# Patient Record
Sex: Female | Born: 1950 | Race: White | Hispanic: No | Smoking: Current every day smoker
Health system: Southern US, Community
[De-identification: ages and names within clinical notes are randomized; demographics above are authoritative.]

## PROBLEM LIST (undated history)

## (undated) DIAGNOSIS — I10 Essential (primary) hypertension: Secondary | ICD-10-CM

## (undated) DIAGNOSIS — C4491 Basal cell carcinoma of skin, unspecified: Secondary | ICD-10-CM

## (undated) DIAGNOSIS — M654 Radial styloid tenosynovitis [de Quervain]: Secondary | ICD-10-CM

## (undated) DIAGNOSIS — M1990 Unspecified osteoarthritis, unspecified site: Secondary | ICD-10-CM

## (undated) DIAGNOSIS — E039 Hypothyroidism, unspecified: Secondary | ICD-10-CM

## (undated) DIAGNOSIS — E7800 Pure hypercholesterolemia, unspecified: Secondary | ICD-10-CM

## (undated) DIAGNOSIS — F4321 Adjustment disorder with depressed mood: Secondary | ICD-10-CM

## (undated) DIAGNOSIS — J189 Pneumonia, unspecified organism: Secondary | ICD-10-CM

## (undated) HISTORY — PX: FRACTURE SURGERY: SHX138

## (undated) HISTORY — PX: MOHS SURGERY: SUR867

---

## 1983-10-02 HISTORY — PX: APPENDECTOMY: SHX54

## 1999-12-19 ENCOUNTER — Other Ambulatory Visit: Admission: RE | Admit: 1999-12-19 | Discharge: 1999-12-19 | Payer: Self-pay | Admitting: *Deleted

## 2000-04-25 ENCOUNTER — Other Ambulatory Visit: Admission: RE | Admit: 2000-04-25 | Discharge: 2000-04-25 | Payer: Self-pay | Admitting: *Deleted

## 2003-02-16 ENCOUNTER — Other Ambulatory Visit: Admission: RE | Admit: 2003-02-16 | Discharge: 2003-02-16 | Payer: Self-pay | Admitting: Obstetrics & Gynecology

## 2005-08-09 ENCOUNTER — Inpatient Hospital Stay (HOSPITAL_COMMUNITY): Admission: RE | Admit: 2005-08-09 | Discharge: 2005-08-13 | Payer: Self-pay | Admitting: Psychiatry

## 2005-08-09 ENCOUNTER — Ambulatory Visit: Payer: Self-pay | Admitting: Psychiatry

## 2007-05-16 ENCOUNTER — Inpatient Hospital Stay (HOSPITAL_COMMUNITY): Admission: EM | Admit: 2007-05-16 | Discharge: 2007-05-19 | Payer: Self-pay | Admitting: Emergency Medicine

## 2007-05-16 ENCOUNTER — Ambulatory Visit: Payer: Self-pay | Admitting: Internal Medicine

## 2007-07-04 ENCOUNTER — Emergency Department (HOSPITAL_COMMUNITY): Admission: EM | Admit: 2007-07-04 | Discharge: 2007-07-05 | Payer: Self-pay | Admitting: Emergency Medicine

## 2007-12-12 ENCOUNTER — Encounter: Admission: RE | Admit: 2007-12-12 | Discharge: 2007-12-12 | Payer: Self-pay | Admitting: Family Medicine

## 2008-10-01 HISTORY — PX: ANKLE FRACTURE SURGERY: SHX122

## 2009-11-22 ENCOUNTER — Emergency Department (HOSPITAL_COMMUNITY): Admission: EM | Admit: 2009-11-22 | Discharge: 2009-11-22 | Payer: Self-pay | Admitting: Emergency Medicine

## 2010-12-20 LAB — URINALYSIS, ROUTINE W REFLEX MICROSCOPIC
Glucose, UA: NEGATIVE mg/dL
Nitrite: NEGATIVE
Protein, ur: NEGATIVE mg/dL
Urobilinogen, UA: 0.2 mg/dL (ref 0.0–1.0)

## 2010-12-20 LAB — CBC
HCT: 36.5 % (ref 36.0–46.0)
MCHC: 35.2 g/dL (ref 30.0–36.0)
Platelets: 210 10*3/uL (ref 150–400)
RDW: 12.8 % (ref 11.5–15.5)

## 2010-12-20 LAB — URINE MICROSCOPIC-ADD ON

## 2010-12-20 LAB — HEMOCCULT GUIAC POC 1CARD (OFFICE): Fecal Occult Bld: NEGATIVE

## 2010-12-20 LAB — DIFFERENTIAL
Eosinophils Absolute: 0.1 10*3/uL (ref 0.0–0.7)
Eosinophils Relative: 2 % (ref 0–5)
Neutro Abs: 1.7 10*3/uL (ref 1.7–7.7)
Neutrophils Relative %: 42 % — ABNORMAL LOW (ref 43–77)

## 2011-02-13 NOTE — Procedures (Signed)
HISTORY OF PRESENT ILLNESS:  This 60 year old patient has a history to  suspected seizures.  Medication list is Ativan, Librium, Zithromax,  heparin, Lexapro, Ambien, Darvocet N 100 and Phenergan.   TECHNICAL DESCRIPTION:  This EEG was recorded during the awake state and  shows much low-voltage fast beta activity.  The awake background  activity does show some higher frequency alpha rhythms in the 14-16 Hz  range.  Drowsiness is recorded but no definite stage II sleep was seen.  No evidence of any epileptiform activity was present.  This was a  portable EEG and photic stimulation and hyperventilation testing were  not performed.   IMPRESSION:  This is a normal EEG during the awake state with much low-  voltage fast beta activity suggestive of drug effect.  Should the  possibility of seizure be sought, then consideration of a sleep-deprived  EEG may be made.           ______________________________  Genene Churn. Sandria Manly, M.D.     XBJ:YNWG  D:  05/19/2007 16:57:06  T:  05/20/2007 10:38:18  Job #:  [id number]   cc:   Barnetta Chapel, MD

## 2011-02-13 NOTE — Discharge Summary (Signed)
[name], [name]                ACCOUNT NO.:  [id number]   MEDICAL RECORD NO.:  [id number]          PATIENT TYPE:  INP   LOCATION:  1612                         FACILITY:  [facility]   PHYSICIAN:  Barnetta Chapel, MDDATE OF BIRTH:  Oct 11, 1950   DATE OF ADMISSION:  05/16/2007  DATE OF DISCHARGE:  05/19/2007                               DISCHARGE SUMMARY   PRIMARY CARE PHYSICIAN:  Duncan Dull, M.D.   DISCHARGE DIAGNOSES:  1. Alcohol abuse.  2. Alcohol dependence.  3. Seizure episode, suspect alcohol-related.  4. Anxiety.  5. Hyponatremia, resolved.  6. Hypokalemia.   DISCHARGE MEDICATIONS:  1. Multivitamin one tab p.o. once daily.  2. Thiamine 100 mg p.o. once daily.  3. Folic acid 1 mg p.o. once daily.  4. Librium 50 mg p.o. q.8 h. for 1 day, then tappered.  5. KCl 20 mEq p.o. once daily for 5 days.   CONSULTATIONS:  Neurology consult done by Dr. Genene Churn. Love, M.D.   IMAGING STUDIES:  1. CT scan of the brain did not reveal any acute abnormalities.  2. MRI of the brain did not reveal any acute abnormalities.  3. EEG.  The official interpretation of the EEG is still pending.  The      primary care [name] should please follow up with this report.   BRIEF HISTORY:  Please refer to the H&P done on May 16, 2007.  The  patient is 60 year old female with a past medical history significant  for prolonged alcohol abuse, insomnia, mood disorder, and abnormal TSH.  The patient may have subclinical hypothyroidism.   HOSPITAL COURSE:  The patient presented to the ER with what appeared to  be a panic attack.  While in the ER, 2 episodes of seizure were said to  have been witnessed.  Apparently, the patient stopped drinking 4 days  prior to presentation to the ER.  The patient was admitted to the  medical floor.  The patient was started on benzodiazepines for possible  alcohol withdrawal.  No further episodes of seizures were observed.  The  patient was also managed with  thiamine, multivitamin, and folic acid.  On admission the patient's sodium was 129 and the potassium was 2.8.  The sodium prior to discharge is already well corrected.  Potassium on  the day of discharge was 3.7.  The patient will be discharged home on K-  Dur 20 mEq p.o. once daily for the next 3 days.  Neurologic consult was  called for the reported seizure episode.  The patient was seen by Dr.  Sandria Manly, neurologist.  Dr. Sandria Manly has instructed that the patient can be  discharged home after EEG.   DISCHARGE PLANS:  1. Discharge the patient home today.  2. Seizure precautions.  3. Follow with the primary care [name], Dr. Shaune Pollack in a week.  4. Follow with Dr. Sandria Manly, neurologist in the next 2-4 weeks.  5. Regular diet.  6. Activity as tolerated.  7. Follow with the psychiatrist.  8. The patient has been advised to avoid alcohol.      Barnetta Chapel,  MD  Electronically Signed     SIO/MEDQ  D:  05/19/2007  T:  05/19/2007  Job:  [id number]   cc:   Duncan Dull, M.D.  Fax: [phone]   Genene Churn. Love, M.D.  Fax: [phone]   Geoffery Lyons, M.D.

## 2011-02-13 NOTE — Consult Note (Signed)
[name], [name]                ACCOUNT NO.:  [id number]   MEDICAL RECORD NO.:  [id number]          PATIENT TYPE:  INP   LOCATION:  1612                         [facility]ACILITY:  [facility]   PHYSICIAN:  Genene Churn. Love, M.D.    DATE OF BIRTH:  1951/04/27   DATE OF CONSULTATION:  05/19/2007  DATE OF DISCHARGE:                                 CONSULTATION   This 60 year old, right-handed, white, divorced female is seen at [facility] on the floor for evaluation of two suspected seizures.   HISTORY OF PRESENT ILLNESS:  [name] gives a long history of anxiety  and depression and a 4-year history of alcohol use since her marriage  with her husband has dissolved.  She has been treated with Effexor,  Zoloft, and most recently Lexapro.  She was having what she suspected  were side effects from the Lexapro, and was it was recommended by Dr.  Evelene Croon, her psychiatrist, that she discontinue the medication.  The  patient then on the third day began developing increasing tremors and  panic attack-like symptoms, called 9-1-1 and was taken to [facility] emergency room.  There she was witnessed to have two  seizures.  She did bite her tongue, though I see no signs of it on  examination. She did not have urinary or bowel incontinence but says  that she did wet her pants when she was having difficulty trying to void  and get on the bedpan.  She had no warning of macropsia, micropsia, deja  vu, strange odors or taste.  She states that she quit alcohol three  months ago but then indicates that she had alcohol or her birthday[birth year]  [birth year] and indicated to Dr. Shaune Pollack by phone that she had  alcohol on August 11.  She had elevated liver function tests obtained by  Dr. Kevan Ny on May 14, 2007.  She has no known history of thyroid  disease, drug use, head or neck trauma, or previous history of seizures.   PHYSICAL EXAMINATION:  GENERAL:  Examination revealed a well-developed,  thin, somewhat  anxious white female.  VITAL SIGNS:  Blood pressure right and left arm 160/80, heart rate 84.  There were no bruits.  NEUROLOGIC:  Mental status:  She was alert, oriented x3, followed three-  step commands.  Cranial nerve examination revealed visual fields full,  disks flat.  Extraocular movements full.  Corneals present.  Facial  sensation equal.  Face symmetric.  Tongue midline.  Uvula midline.  Gag  present.  Sternocleidomastoid and trapezius testing normal. Motor  examination:  Good strength upper and lower extremities. Outstretched  hand and arm tremor noted.  She had intact sensory examination to  pinprick, light touch, joint position, and vibration.  Deep tendon  reflexes were 1+ in the upper extremities, zero in lower extremities.  Plantar responses were downgoing.  Gait examination was within normal  limits.   CT scan of the brain was normal.   MRI study of the brain was normal.   Laboratory data was remarkable for elevated liver function tests.  Serum  sodium was 129, potassium 2.8.  TSH was also mildly elevated.  Alcohol  level was less than 5.0.  Amylase was 105, lipase 24, magnesium 1.9.  She was given thiamine and KCl and folic acid in the emergency room.  INR was 1.2.  PTT was 26.  Myoglobin was 240. Drug screen was negative.   IMPRESSION:  1. Possible seizures x2 (Code 345.10). Consider alcohol withdrawal,      though history of last date of alcohol use does not fit to well      with her possibility of withdrawal.  2. Anxiety (Code 300.00).  3. Depression (Code 311).  4. Elevated liver function tests.   PLAN:  At this time, place the patient on seizure precautions, obtain an  EEG, and follow up as an outpatient.  I have talked with her about not  driving a car and seizure precautions.  She indicates that she does not  drive a car since she has had at the DUI.           ______________________________  Genene Churn. Sandria Manly, M.D.     JML/MEDQ  D:  05/19/2007  T:   05/19/2007  Job:  [id number]

## 2011-02-13 NOTE — H&P (Signed)
NAMEVALBONA, SLABACH                ACCOUNT NO.:  [id number]   MEDICAL RECORD NO.:  [id number]          PATIENT TYPE:  INP   LOCATION:  1612                         FACILITY:  [facility]   PHYSICIAN:  Corwin Levins, MD      DATE OF BIRTH:  Feb 15, 1951   DATE OF ADMISSION:  05/16/2007  DATE OF DISCHARGE:                              HISTORY & PHYSICAL   CHIEF COMPLAINT:  The patient complains of questionable Lexapro side  effects recently begun with freeze brain and possible panic attacks  for which she called EMS and was thereafter noted to have two seizures.   HISTORY OF PRESENT ILLNESS:  Ms. Schaff is a 60 year old white female  here with the above.  She called EMS for herself and on arrival was  noted by the EMS and the intake nurse to have seizure x2.  She was  postictal for several minutes and has otherwise been stable.  The  patient herself complains of anxiety and panic-like symptoms, sore  throat, but no other pains or other symptoms.  She states she has had  longstanding abdominal discomfort as well, but this resolved when she  began IV fluids here in the ER.   PAST MEDICAL HISTORY:  1. Illness:  Mood disorder, not otherwise specified, with voluntary      hospitalization, December 2006.  2. Questionable abnormal TSH noted in the discharge summary per Dr.      Dub Mikes.  No other details known.  3. History of alcohol abuse, longstanding.  4. Insomnia.   PAST SURGICAL HISTORY:  1. Status post appendectomy.  2. Status post eye surgery as a child.   ALLERGIES:  1. SEROQUEL.  2. ALL DEPRESSANTS except Zoloft per patient.   CURRENT MEDICATIONS:  Phenergan p.r.n.   SOCIAL HISTORY:  Tobacco one half pack per day.  Alcohol:  Denies now  and quite evasive about overall intake.  Divorced, three children.  Retired Clinical biochemist.   FAMILY HISTORY/REVIEW OF SYSTEMS:  Otherwise noncontributory, except for  sore throat.   PHYSICAL EXAMINATION:  VITAL SIGNS:  Temperature 99.3, blood  pressure  120/70, respirations 20, heart rate 67, O2 saturation 100%  HEENT:  Sclerae clear.  TMs clear.  Pharynx with moderate erythema.  CHEST:  No rales or wheezing.  CARDIAC:  Regular rate and rhythm.  ABDOMEN:  Soft, nontender.  Positive bowel sounds.  EXTREMITIES:  No edema.   LABORATORY DATA:  Chest CT:  No acute disease.  Chest x-ray:  No acute  disease.   UA negative.  CPK 1.1, troponin I less than 0.05.  Urine drug screen  negative.  Amylase 1.9, lipase 24.  INR 1.  White blood cell count 8.7,  hemoglobin 13.8.  Alcohol less than 5, amylase 105.  Electrolytes:  Sodium 129, potassium 2.8, BUN 2, creatinine 0.7, glucose 135.  LFTs  within normal limits except for SGOT 129 and SGPT 136.   ECG not on chart but apparently done.   ASSESSMENT/PLAN:  1. Seizure, new onset, most likely etiology seeming to be alcohol      withdrawal versus other.  She  is to be admitted, monitored.  Apply      neuro checks, give O2.  Check head MRI.  Consider neuro consult.      Hold the antiepileptic for now.  2. Alcohol dependent.  Start Librium.  Watch for DTs which seem a bit      more than likely at this point.  Add multivitamin, thiamine, and      Colace.  3. Questionable abnormal TSH.  Will check TSH.  4. Elevated LFTs.  Questionable secondary to alcohol hepatitis      __________ panel and abdomen ultrasounds.  5. Hyponatremia due to lack of fluids.  6. Hypokalemia.  Replace p.o. and recheck.  7. Hyperglycemia.  Check A1c.  8. Pharyngitis.  Get Azithromycin Z-pack x1.  9. Insomnia.  __________ q.h.s. p.r.n.  10.Prophylaxis.  Start PPI therapy and Lovenox subcu.  11.__________   DISPOSITION:  For home when improved.      Corwin Levins, MD  Electronically Signed     JWJ/MEDQ  D:  05/16/2007  T:  05/17/2007  Job:  [id number]   cc:   Duncan Dull, M.D.  Fax: [phone]   Geoffery Lyons, M.D.

## 2011-02-16 NOTE — Discharge Summary (Signed)
NAMERADLEY, TESTON                ACCOUNT NO.:  [id number]   MEDICAL RECORD NO.:  [id number]          PATIENT TYPE:  IPS   LOCATION:  0503                          FACILITY:  [facility]   PHYSICIAN:  Geoffery Lyons, M.D.      DATE OF BIRTH:  16-Jun-1951   DATE OF ADMISSION:  08/09/2005  DATE OF DISCHARGE:  08/13/2005                                 DISCHARGE SUMMARY   CHIEF COMPLAINT AND PRESENT ILLNESS:  This was the first admission to [facility] for this 60 year old white female, married,  voluntarily admitted.  Referred by her psychotherapist, Dr. Ledon Snare, due to  personality changes, becoming more defensive and paranoid and has developed  paranoid ideation about builder of her townhouse.  Has been drinking  alcohol.  She felt her family was against her and this is what she told her  therapist, that she would be better off dead.  This was the event that  prompted the therapist to bring her to our institution.   PAST PSYCHIATRIC HISTORY:  Actively in counseling with Dr. Carlus Pavlov.   ALCOHOL/DRUG HISTORY:  Three to five beers daily but minimizes.  Evasive  when questioned about her alcohol use.   MEDICAL HISTORY:  Noncontributory.   MEDICATIONS:  Xanax XR 3 mg in the a.m.   PHYSICAL EXAMINATION:  Performed and failed to show any acute findings.   LABORATORY DATA:  CBC with white blood cells 5.3, hemoglobin 13.6.  Liver  enzymes with SGOT 95, SGPT 91, TSH 7.856, T3 was 40.4, free T4 was 1.16.  Hepatitis profile was negative.   MENTAL STATUS EXAM:  Alert female, angry because of the admission,  uncooperative.  Speech was pressured.  Mood agitated, angry for being in the  unit.  Thought process logical, coherent and relevant but going into details  about her situation with the townhouses and how they build it as well as  some other people in her family have turned against her, very suspicious.  Cognition was well-preserved.   ADMISSION DIAGNOSES:  AXIS I:  Mood  disorder not otherwise specified.  Rule  out psychotic features.  Alcohol abuse; rule out dependence.  AXIS II:  No diagnosis.  AXIS III:  No diagnosis.  AXIS IV:  Moderate.  AXIS V:  GAF upon admission 30; highest GAF in the last year 75-80.   HOSPITAL COURSE:  She was admitted.  Voluntary admission.  She was started  on Librium detox.  We started working with some Risperdal.  She was also  given some Seroquel for sleep.  Dr. Ledon Snare was able to communicate with  this physician.  She had a history of __________ dysfunction over the last  several weeks.  She was Paxil, which she denied.  She discontinued it.  Since then, she has been more restless, disorganized, has been more  suspicious, paranoid, blaming family members for different things.  Focused  on her townhouse and what is wrong with it.  Endorsed that there was some  sort of problem with the electronic wiring and, as of lately, she has  smelled sewer.  Has tried for the builder to be responsible.  Has tried to  get lawyers involved but it seems that she fires the lawyers when they do  not do what she expects them to do.  There was some concern about her  alcohol use as well as Xanax.  She had stated and left messages that she  would rather die, that she was going to hurt herself, although she denied.  On evaluation, she had pressured speech, very anxious, agitated because of  all that was going on.  Wanting to leave the hospital.  On November 10th,  met with the patient and a friend with patient's permission.  She signed a  medical affidavit power of attorney where the friend was going to assume  responsibility for the patient's well-being and she was not going to blame  the hospital if something was to happen to her.  It seemed that the friend  did not have firsthand knowledge of everything that was going on.  She did  sign 72 hours for discharge against medical advice.  There was a  conversation with the husband over the  phone.  On November 10th, the husband  endorsed that she seemed to be 100% better and she had promised to make some  changes.  Husband denied any specific safety concerns.  She continued to  evidence the pressured speech, the anxiety, the agitation.  She continued to  refuse Risperdal.  She was aware of her abnormal TSH and she was going to  wait until she saw Dr. Pete Glatter, her physician, to address this finding.  She continued to refuse medications.  On November 13th, she did take the  medication.  On November 13th, she was in full contact with reality.  Endorsed she seemed to indeed be more contained.  There was no evidence of  suicidal or homicidal ideation, hallucinations, delusions.  She had  evidenced marked changes on her behavior from November 10th, more and more  focused, less distractible, no evidence of psychosis.  Denied being  suicidal.  Endorsed she had to be there for her children.  She was going to  let the attorney and the realtor deal with the situation with the townhouse.  She was going to go home and finish moving.  Staying at the husband's house.  Eventually will make it to [place] where the husband was going to reside.  Endorsed that, although she does not consider herself alcoholic, she was  willing to abstain and she was also willing to work with Dr. Ledon Snare.  As  she was in full contact with reality, markedly improved, no suicidal or  homicidal ideation, we went ahead and discharged to outpatient follow-up.   DISCHARGE DIAGNOSES:  AXIS I:  Mood disorder not otherwise specified.  Rule  out psychotic features.  Rule out alcohol abuse.  AXIS II:  No diagnosis.  AXIS III:  No diagnosis.  AXIS IV:  Moderate.  AXIS V:  GAF upon discharge 50.   DISCHARGE MEDICATIONS:  1.  Seroquel 100 mg at bedtime.  2.  Risperdal 0.25 mg twice a day.   FOLLOW UP:  Dr. Lolly Mustache and Carlus Pavlov.      Geoffery Lyons, M.D.  Electronically Signed    IL/MEDQ  D:  08/21/2005   T:  08/22/2005  Job:  [id number]

## 2011-07-12 LAB — COMPREHENSIVE METABOLIC PANEL
ALT: 56 — ABNORMAL HIGH
AST: 44 — ABNORMAL HIGH
Albumin: 4.3
Alkaline Phosphatase: 54
Calcium: 9.2
Creatinine, Ser: 0.55
GFR calc non Af Amer: >60
Glucose, Bld: 93
Total Bilirubin: 1.1
Total Protein: 6.7

## 2011-07-12 LAB — DIFFERENTIAL
Basophils Relative: 1
Lymphocytes Relative: 21
Lymphs Abs: 1.4
Monocytes Absolute: 1 — ABNORMAL HIGH

## 2011-07-12 LAB — URINALYSIS, ROUTINE W REFLEX MICROSCOPIC
Bilirubin Urine: NEGATIVE
Ketones, ur: 40 — AB
Nitrite: NEGATIVE
pH: 6.5

## 2011-07-12 LAB — CBC
HCT: 37.1
Platelets: 231
WBC: 6.6

## 2011-07-12 LAB — ETHANOL: Alcohol, Ethyl (B): <5

## 2011-07-13 LAB — BASIC METABOLIC PANEL
BUN: 2 — ABNORMAL LOW
BUN: 5 — ABNORMAL LOW
BUN: <1 — ABNORMAL LOW
CO2: 24
CO2: 25
CO2: 29
Calcium: 8.9
Calcium: 9.2
Calcium: 9.4
Chloride: 104
Chloride: 109
Chloride: 109
Creatinine, Ser: 0.55
Creatinine, Ser: 0.55
Creatinine, Ser: 0.57
GFR calc Af Amer: >60
GFR calc Af Amer: >60
GFR calc Af Amer: >60
GFR calc non Af Amer: >60
GFR calc non Af Amer: >60
GFR calc non Af Amer: >60
Glucose, Bld: 106 — ABNORMAL HIGH
Glucose, Bld: 95
Glucose, Bld: 96
Potassium: 3.4 — ABNORMAL LOW
Potassium: 3.7
Potassium: 3.8
Sodium: 136
Sodium: 140
Sodium: 144

## 2011-07-13 LAB — PROTIME-INR: INR: 1

## 2011-07-13 LAB — COMPREHENSIVE METABOLIC PANEL
Albumin: 4.4
GFR calc Af Amer: >60
GFR calc non Af Amer: >60
Total Protein: 7.2

## 2011-07-13 LAB — HEPATITIS PANEL, ACUTE
HCV Ab: NEGATIVE
Hep A IgM: NEGATIVE
Hep B C IgM: NEGATIVE
Hepatitis B Surface Ag: NEGATIVE

## 2011-07-13 LAB — URINALYSIS, ROUTINE W REFLEX MICROSCOPIC
Glucose, UA: NEGATIVE
Hgb urine dipstick: NEGATIVE
Ketones, ur: NEGATIVE
Protein, ur: NEGATIVE

## 2011-07-13 LAB — CBC
HCT: 35.2 — ABNORMAL LOW
HCT: 38.8
Hemoglobin: 12.5
Hemoglobin: 13.8
MCHC: 35.4
MCHC: 35.6
MCV: 93.5
MCV: 93.5
Platelets: 176
RBC: 3.77 — ABNORMAL LOW
RBC: 4.15
RDW: 14.8 — ABNORMAL HIGH
RDW: 14.8 — ABNORMAL HIGH
WBC: 7.8

## 2011-07-13 LAB — DIFFERENTIAL
Eosinophils Absolute: 0.1
Eosinophils Relative: 1
Lymphs Abs: 2.7
Monocytes Absolute: 1 — ABNORMAL HIGH
Monocytes Relative: 12 — ABNORMAL HIGH
Neutro Abs: 4.8
Neutrophils Relative %: 55

## 2011-07-13 LAB — HEMOGLOBIN A1C
Hgb A1c MFr Bld: 5
Mean Plasma Glucose: 101

## 2011-07-13 LAB — T3, FREE: T3, Free: 2.4 (ref 2.3–4.2)

## 2011-07-13 LAB — RAPID URINE DRUG SCREEN, HOSP PERFORMED
Barbiturates: NOT DETECTED
Benzodiazepines: NOT DETECTED
Cocaine: NOT DETECTED

## 2011-07-13 LAB — T4, FREE: Free T4: 1.23

## 2011-07-13 LAB — ETHANOL: Alcohol, Ethyl (B): <5

## 2011-07-13 LAB — POCT CARDIAC MARKERS
Troponin i, poc: <0.05
Troponin i, poc: <0.05

## 2011-07-13 LAB — APTT: aPTT: 26

## 2011-07-13 LAB — TSH: TSH: 7.391 — ABNORMAL HIGH

## 2011-07-13 LAB — MAGNESIUM: Magnesium: 1.9

## 2015-04-13 ENCOUNTER — Emergency Department (HOSPITAL_COMMUNITY): Payer: BLUE CROSS/BLUE SHIELD

## 2015-04-13 ENCOUNTER — Encounter (HOSPITAL_COMMUNITY): Payer: Self-pay | Admitting: *Deleted

## 2015-04-13 ENCOUNTER — Observation Stay (HOSPITAL_COMMUNITY)
Admission: EM | Admit: 2015-04-13 | Discharge: 2015-04-14 | Disposition: A | Discharge status: Home / Self Care | Payer: BLUE CROSS/BLUE SHIELD | Attending: Internal Medicine | Admitting: Internal Medicine

## 2015-04-13 DIAGNOSIS — F329 Major depressive disorder, single episode, unspecified: Secondary | ICD-10-CM

## 2015-04-13 DIAGNOSIS — F319 Bipolar disorder, unspecified: Secondary | ICD-10-CM | POA: Diagnosis not present

## 2015-04-13 DIAGNOSIS — R079 Chest pain, unspecified: Principal | ICD-10-CM | POA: Insufficient documentation

## 2015-04-13 DIAGNOSIS — R0789 Other chest pain: Secondary | ICD-10-CM | POA: Diagnosis not present

## 2015-04-13 DIAGNOSIS — F17210 Nicotine dependence, cigarettes, uncomplicated: Secondary | ICD-10-CM | POA: Diagnosis not present

## 2015-04-13 DIAGNOSIS — R6884 Jaw pain: Secondary | ICD-10-CM | POA: Insufficient documentation

## 2015-04-13 DIAGNOSIS — Z720 Tobacco use: Secondary | ICD-10-CM | POA: Insufficient documentation

## 2015-04-13 DIAGNOSIS — I10 Essential (primary) hypertension: Secondary | ICD-10-CM | POA: Insufficient documentation

## 2015-04-13 DIAGNOSIS — K219 Gastro-esophageal reflux disease without esophagitis: Secondary | ICD-10-CM

## 2015-04-13 DIAGNOSIS — R110 Nausea: Secondary | ICD-10-CM | POA: Insufficient documentation

## 2015-04-13 DIAGNOSIS — E785 Hyperlipidemia, unspecified: Secondary | ICD-10-CM

## 2015-04-13 HISTORY — DX: Hypothyroidism, unspecified: E03.9

## 2015-04-13 HISTORY — DX: Pneumonia, unspecified organism: J18.9

## 2015-04-13 HISTORY — DX: Radial styloid tenosynovitis (de quervain): M65.4

## 2015-04-13 HISTORY — DX: Basal cell carcinoma of skin, unspecified: C44.91

## 2015-04-13 HISTORY — DX: Pure hypercholesterolemia, unspecified: E78.00

## 2015-04-13 HISTORY — DX: Essential (primary) hypertension: I10

## 2015-04-13 HISTORY — DX: Unspecified osteoarthritis, unspecified site: M19.90

## 2015-04-13 HISTORY — DX: Adjustment disorder with depressed mood: F43.21

## 2015-04-13 LAB — BASIC METABOLIC PANEL
Anion gap: 9 (ref 5–15)
BUN: 6 mg/dL (ref 6–20)
CALCIUM: 10 mg/dL (ref 8.9–10.3)
CO2: 27 mmol/L (ref 22–32)
CREATININE: 0.75 mg/dL (ref 0.44–1.00)
Chloride: 102 mmol/L (ref 101–111)
GFR calc Af Amer: >60 mL/min (ref >60)
GFR calc non Af Amer: >60 mL/min (ref >60)
Glucose, Bld: 135 mg/dL — ABNORMAL HIGH (ref 65–99)
Potassium: 3.9 mmol/L (ref 3.5–5.1)
Sodium: 138 mmol/L (ref 135–145)

## 2015-04-13 LAB — CBC
HCT: 42 % (ref 36.0–46.0)
Hemoglobin: 14.5 g/dL (ref 12.0–15.0)
MCH: 31.2 pg (ref 26.0–34.0)
MCHC: 34.5 g/dL (ref 30.0–36.0)
MCV: 90.3 fL (ref 78.0–100.0)
PLATELETS: 267 10*3/uL (ref 150–400)
RBC: 4.65 MIL/uL (ref 3.87–5.11)
RDW: 13 % (ref 11.5–15.5)
WBC: 6.3 10*3/uL (ref 4.0–10.5)

## 2015-04-13 LAB — LIPID PANEL
Cholesterol: 252 mg/dL — ABNORMAL HIGH (ref 0–200)
HDL: 78 mg/dL (ref >40)
LDL Cholesterol: 161 mg/dL — ABNORMAL HIGH (ref 0–99)
TRIGLYCERIDES: 67 mg/dL (ref <150)
Total CHOL/HDL Ratio: 3.2 RATIO
VLDL: 13 mg/dL (ref 0–40)

## 2015-04-13 LAB — I-STAT TROPONIN, ED: Troponin i, poc: 0 ng/mL (ref 0.00–0.08)

## 2015-04-13 LAB — TROPONIN I

## 2015-04-13 MED ORDER — ENOXAPARIN SODIUM 40 MG/0.4ML ~~LOC~~ SOLN
40.0000 mg | SUBCUTANEOUS | Status: DC
  Filled 2015-04-13: qty 0.4

## 2015-04-13 MED ORDER — SODIUM CHLORIDE 0.9 % IJ SOLN
3.0000 mL | Freq: Two times a day (BID) | INTRAMUSCULAR | Status: DC
  Administered 2015-04-13 – 2015-04-14 (×3): 3 mL via INTRAVENOUS

## 2015-04-13 MED ORDER — ASPIRIN 81 MG PO CHEW
324.0000 mg | CHEWABLE_TABLET | Freq: Once | ORAL | Status: AC
  Administered 2015-04-13: 324 mg via ORAL
  Filled 2015-04-13: qty 4

## 2015-04-13 MED ORDER — SERTRALINE HCL 50 MG PO TABS
150.0000 mg | ORAL_TABLET | Freq: Every day | ORAL | Status: DC
  Administered 2015-04-14: 150 mg via ORAL
  Filled 2015-04-13 (×2): qty 1

## 2015-04-13 MED ORDER — GABAPENTIN 300 MG PO CAPS
300.0000 mg | ORAL_CAPSULE | Freq: Every day | ORAL | Status: DC
  Administered 2015-04-13: 300 mg via ORAL
  Filled 2015-04-13: qty 1

## 2015-04-13 MED ORDER — ONDANSETRON HCL 4 MG/2ML IJ SOLN
4.0000 mg | Freq: Four times a day (QID) | INTRAMUSCULAR | Status: DC | PRN
Start: 2015-04-13 — End: 2015-04-14

## 2015-04-13 MED ORDER — HYDROCHLOROTHIAZIDE 12.5 MG PO CAPS
12.5000 mg | ORAL_CAPSULE | Freq: Every day | ORAL | Status: DC
  Administered 2015-04-13 – 2015-04-14 (×2): 12.5 mg via ORAL
  Filled 2015-04-13 (×2): qty 1

## 2015-04-13 MED ORDER — ASPIRIN EC 81 MG PO TBEC
81.0000 mg | DELAYED_RELEASE_TABLET | Freq: Every day | ORAL | Status: DC
  Administered 2015-04-14: 81 mg via ORAL
  Filled 2015-04-13: qty 1

## 2015-04-13 MED ORDER — PANTOPRAZOLE SODIUM 40 MG PO TBEC
40.0000 mg | DELAYED_RELEASE_TABLET | Freq: Every day | ORAL | Status: DC
  Administered 2015-04-13 – 2015-04-14 (×2): 40 mg via ORAL
  Filled 2015-04-13 (×2): qty 1

## 2015-04-13 MED ORDER — LAMOTRIGINE 100 MG PO TABS
100.0000 mg | ORAL_TABLET | Freq: Two times a day (BID) | ORAL | Status: DC
  Administered 2015-04-13 – 2015-04-14 (×2): 100 mg via ORAL
  Filled 2015-04-13 (×2): qty 1

## 2015-04-13 MED ORDER — ONDANSETRON HCL 4 MG PO TABS
4.0000 mg | ORAL_TABLET | Freq: Four times a day (QID) | ORAL | Status: DC | PRN
Start: 2015-04-13 — End: 2015-04-14

## 2015-04-13 NOTE — ED Provider Notes (Signed)
CSN: [id number]     Arrival date & time 04/13/15  1144 History   First MD Initiated Contact with Patient 04/13/15 1209     Chief Complaint  Patient presents with  . Chest Pain  . Jaw Pain     (Consider location/radiation/quality/duration/timing/severity/associated sxs/prior Treatment) HPI  Blood pressure 170/92, pulse 92, temperature 98.7 F (37.1 C), temperature source Oral, resp. rate 18, height 5\' 2"  (1.575 m), weight 118 lb (53.524 kg), SpO2 96 %.   [name] is a 64 y.o. female c/o substernal, burning chest pain rated at 7 out of 10 radiating the to the bilateral jaws onset 2 weeks ago, she's had 4 episodes in this timeframe. The episodes always come on when she is at rest, sitting at her desk. Episodes last approximately 20 minutes, they resolve spontaneously. Patient has no pain right now, she called her primary care physician to make an appointment was instructed to go directly to the ED. Last episode was yesterday. It associated with nausea and lightheadedness, no diaphoresis, pleuritic nature, fever, chills, cough, shortness of breath, history of DVT or PE, recent mobilizations, calf pain or leg swelling, recent cocaine/methamphetimine use.   Pt has not received any ASA or NTG in the last 24 hours.  RF: Patient has a 11-15 pack year history of smoking, she smokes daily. Her brother had a heart attack at 49 No History of stress tests PCP: [facility] Rondall Allegra)    Past Medical History  Diagnosis Date  . Bipolar 1 disorder   . Depression    History reviewed. No pertinent past surgical history. History reviewed. No pertinent family history. History  Substance Use Topics  . Smoking status: Current Every Day Smoker    Types: Cigarettes  . Smokeless tobacco: Not on file  . Alcohol Use: No   OB History    No data available     Review of Systems  10 systems reviewed and found to be negative, except as noted in the HPI.   Allergies  Review of patient's allergies  indicates no known allergies.  Home Medications   Prior to Admission medications   Not on File   BP 150/88 mmHg  Pulse 71  Temp(Src) 98.7 F (37.1 C) (Oral)  Resp 19  Ht 5\' 2"  (1.575 m)  Wt 118 lb (53.524 kg)  BMI 21.58 kg/m2  SpO2 97% Physical Exam  Constitutional: She is oriented to person, place, and time. She appears well-developed and well-nourished. No distress.  HENT:  Head: Normocephalic.  Mouth/Throat: Oropharynx is clear and moist.  Eyes: Conjunctivae are normal.  Neck: Normal range of motion. No JVD present. No tracheal deviation present.  Cardiovascular: Normal rate, regular rhythm and intact distal pulses.   Radial pulse equal bilaterally  Pulmonary/Chest: Effort normal and breath sounds normal. No stridor. No respiratory distress. She has no wheezes. She has no rales. She exhibits no tenderness.  Abdominal: Soft. She exhibits no distension and no mass. There is no tenderness. There is no rebound and no guarding.  Musculoskeletal: Normal range of motion. She exhibits no edema or tenderness.  No calf asymmetry, superficial collaterals, palpable cords, edema, Homans sign negative bilaterally.    Neurological: She is alert and oriented to person, place, and time.  Skin: Skin is warm. She is not diaphoretic.  Psychiatric: She has a normal mood and affect.  Nursing note and vitals reviewed.   ED Course  Procedures (including critical care time) Labs Review Labs Reviewed  BASIC METABOLIC PANEL - Abnormal; Notable  for the following:    Glucose, Bld 135 (*)    All other components within normal limits  CBC  I-STAT TROPOININ, ED    Imaging Review Dg Chest 2 View  04/13/2015   CLINICAL DATA:  Chest pain  EXAM: CHEST  2 VIEW  COMPARISON:  12/30/2007  FINDINGS: Cardiomediastinal silhouette is stable. No acute infiltrate or pleural effusion. No pulmonary edema. Bony thorax is unremarkable.  IMPRESSION: No active cardiopulmonary disease.   Electronically Signed   By:  Lahoma Crocker M.D.   On: 04/13/2015 12:42     EKG Interpretation None      MDM   Final diagnoses:  Chest pain at rest    Filed Vitals:   04/13/15 1158 04/13/15 1315  BP: 170/92 150/88  Pulse: 92 71  Temp: 98.7 F (37.1 C)   TempSrc: Oral   Resp: 18 19  Height: 5\' 2"  (1.575 m)   Weight: 118 lb (53.524 kg)   SpO2: 96% 97%    Medications  aspirin chewable tablet 324 mg (324 mg Oral Given 04/13/15 1252)    [name] is a pleasant 64 y.o. female presenting with Chase pain radiating to both jaws associated with lightheaded sensation and nausea intermittently while at rest over the last 2 weeks. Patient has cardiac risk factors of advanced age, family history, tobacco use. EKG with right bundle, no prior to compare.  Moderate risk by heart score, will likely need admission for cardiac rule out. Chest x-ray and blood work negative. Patient given aspirin, will be an unassigned admission for cardiac rule out.  This will be an unassigned admission to family medicine, discussed case with resident Ronnald Ramp, he accepts observation admission to telemetry bed. Attending is Dr. Lattie Corns Wilbert Hayashi, PA-C 04/13/15 1354  Pamella Pert, MD 04/14/15 [phone]

## 2015-04-13 NOTE — ED Notes (Signed)
MD at bedside. 

## 2015-04-13 NOTE — ED Notes (Signed)
Pt reports intermittent chest pains over past two weeks, now having intense jaw pain when the chest pain occurs and mild dizziness.

## 2015-04-13 NOTE — H&P (Signed)
Date: 04/13/2015               Patient Name:  [name] MRN: [id number]  DOB: [birth year] Age / Sex: 64 y.o., female   PCP: Adline Potter, DO         Medical Service: Internal Medicine Teaching Service         Attending Physician: Dr. Aldine Contes, MD    First Contact: Dr. Randell Patient Pager: [phone]  Second Contact: Dr. Ronnald Ramp Pager: [phone]       After Hours (After 5p/  First Contact Pager: [phone]  weekends / holidays): Second Contact Pager: [phone]   Chief Complaint: chest pain  History of Present Illness: [name] is a 64 year old woman with history of depression presenting with chest pain. The chest pain started 2.5 weeks ago, and she has had 4 episodes. The last episode was yesterday. The episodes usually last less than 20 minutes. The last one was the longest and lasted about 20 minutes. Onset is sudden. She describes the pain as burning midsternum. Radiates to her back. No exacerbating or relieving factors. It occurs at rest. She has associated bilateral jaw pain that is sharp and constant. No similar episodes before. She feels warm with these episodes but denies diaphoresis, SOB, nausea, anxiety. Denies acid reflux, exertional chest pain or shortness of breath.   Meds: No current facility-administered medications for this encounter.   Current Outpatient Prescriptions  Medication Sig Dispense Refill  . gabapentin (NEURONTIN) 300 MG capsule Take 300 mg by mouth at bedtime.    . lamoTRIgine (LAMICTAL) 100 MG tablet Take 100 mg by mouth 2 (two) times daily.    . sertraline (ZOLOFT) 50 MG tablet Take 150 mg by mouth daily.      Allergies: Allergies as of 04/13/2015  . (No Known Allergies)   Past Medical History  Diagnosis Date  . Bipolar 1 disorder   . Depression    History reviewed. No pertinent past surgical history.   Family history: Brother had MI, Father had CAD, Paternal grandmother had CHF   History   Social History  . Marital Status: Married      Spouse Name: N/A  . Number of Children: N/A  . Years of Education: N/A   Occupational History  . Not on file.   Social History Main Topics  . Smoking status: Current Every Day Smoker    Types: Cigarettes  . Smokeless tobacco: Not on file  . Alcohol Use: No  . Drug Use: No  . Sexual Activity: Not on file   Other Topics Concern  . Not on file   Social History Narrative  . No narrative on file  Tob: >1ppd for 11 years EtOH: 3-4 beers a day. 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   Review of Systems: Constitutional: no fevers/chills Eyes: no vision changes Ears, nose, mouth, throat, and face: no cough Respiratory: no shortness of breath Cardiovascular: +chest pain Gastrointestinal: no nausea/vomiting, no abdominal pain, no constipation, no diarrhea Genitourinary: no dysuria, no hematuria Integument: no rash Hematologic/lymphatic: no bleeding/bruising, no edema Musculoskeletal: no arthralgias, no myalgias Neurological: no paresthesias, no weakness  Physical Exam: Blood pressure 155/83, pulse 76, temperature 98.7 F (37.1 C), temperature source Oral, resp. rate 17, height 5\' 2"  (1.575 m), weight 118 lb (53.524 kg), SpO2 95 %. General Apperance: NAD Head: Normocephalic, atraumatic Eyes: PERRL, EOMI, anicteric sclera Ears: Normal external ear canal Nose: Nares normal, septum  midline, mucosa normal Throat: Lips, mucosa and tongue normal  Neck: Supple, trachea midline Back: No tenderness or bony abnormality  Lungs: Clear to auscultation bilaterally. No wheezes, rhonchi or rales. Breathing comfortably Chest Wall: Nontender, no deformity Heart: Regular rate and rhythm, no murmur/rub/gallop Abdomen: Soft, nontender, nondistended, no rebound/guarding Extremities: Normal, atraumatic, warm and well perfused, no edema Pulses: 2+ throughout Skin: No rashes or lesions Neurologic: Alert and oriented x  3. CNII-XII intact. Normal strength and sensation  Lab results: Basic Metabolic Panel:  Recent Labs  04/13/15 1200  NA 138  K 3.9  CL 102  CO2 27  GLUCOSE 135*  BUN 6  CREATININE 0.75  CALCIUM 10.0   CBC:  Recent Labs  04/13/15 1200  WBC 6.3  HGB 14.5  HCT 42.0  MCV 90.3  PLT 267   Cardiac Enzymes: No results for input(s): CKTOTAL, CKMB, CKMBINDEX, TROPONINI in the last 72 hours.  Urine Drug Screen: Drugs of Abuse     Component Value Date/Time   LABOPIA NONE DETECTED 05/16/2007 1935   COCAINSCRNUR NONE DETECTED 05/16/2007 1935   LABBENZ NONE DETECTED 05/16/2007 1935   AMPHETMU NONE DETECTED 05/16/2007 1935   THCU NONE DETECTED 05/16/2007 1935   LABBARB  05/16/2007 1935    NONE DETECTED        DRUG SCREEN FOR MEDICAL PURPOSES ONLY.  IF CONFIRMATION IS NEEDED FOR ANY PURPOSE, NOTIFY LAB WITHIN 5 DAYS.     Imaging results:  Dg Chest 2 View  04/13/2015   CLINICAL DATA:  Chest pain  EXAM: CHEST  2 VIEW  COMPARISON:  12/30/2007  FINDINGS: Cardiomediastinal silhouette is stable. No acute infiltrate or pleural effusion. No pulmonary edema. Bony thorax is unremarkable.  IMPRESSION: No active cardiopulmonary disease.   Electronically Signed   By: Lahoma Crocker M.D.   On: 04/13/2015 12:42    Other results: EKG: normal sinus rhythm, no previous EKG for comparison  Assessment & Plan by Problem: Active Problems:   Chest pain  Chest pain: Stress and anxiety may be contributing to her symptoms as she mentioned that she has been having more stress due to school/job. GERD is also another possibility as she reports burning sensation. Pain is not reproducible on palpation making MSK etiology less likely. CXR with no acute cardiopulmonary process. Do not suspect PE as Wells score 0 and Geneva score 3 (heart rate 75-94) making her low risk for PE. Initial troponin negative and EKG without acute ischemic changes. Her TIMI score is 1 (5% risk at 14 days) and HEART score 3 points. She  received ASA 325mg  x 1. -UDS pending -repeat EKG in AM -trend troponins -lipid panel -hemoglobin A1c  -morphine 1mg  Q2hr prn pain -nitrostat SL 0.4mg  Q101min prn chest pain -tele -ASA 81mg  daily -Protonix 40mg  daily  Hypertension: BP 150/88-170/92. -Start HCTZ 12.5mg  daily  Depression: Continue home gabapentin 300mg  QHS, Lamictal 100mg  BID, sertraline 150mg  daily  FEN: General diet  VTE ppx: lovenox  Dispo: Disposition is deferred at this time, awaiting improvement of current medical problems. Anticipated discharge in approximately 1-2 day(s).   The patient does have a current PCP Adline Potter, DO) and does not need an [facility] hospital follow-up appointment after discharge.  The patient does not have transportation limitations that hinder transportation to clinic appointments.  Signed: Milagros Loll, MD  04/13/2015, 2:16 PM

## 2015-04-14 ENCOUNTER — Observation Stay (HOSPITAL_COMMUNITY): Payer: BLUE CROSS/BLUE SHIELD

## 2015-04-14 DIAGNOSIS — E785 Hyperlipidemia, unspecified: Secondary | ICD-10-CM

## 2015-04-14 DIAGNOSIS — F319 Bipolar disorder, unspecified: Secondary | ICD-10-CM | POA: Diagnosis not present

## 2015-04-14 DIAGNOSIS — R110 Nausea: Secondary | ICD-10-CM | POA: Diagnosis not present

## 2015-04-14 DIAGNOSIS — R0789 Other chest pain: Secondary | ICD-10-CM | POA: Diagnosis not present

## 2015-04-14 DIAGNOSIS — R6884 Jaw pain: Secondary | ICD-10-CM | POA: Diagnosis not present

## 2015-04-14 DIAGNOSIS — K219 Gastro-esophageal reflux disease without esophagitis: Secondary | ICD-10-CM | POA: Diagnosis not present

## 2015-04-14 DIAGNOSIS — I10 Essential (primary) hypertension: Secondary | ICD-10-CM

## 2015-04-14 DIAGNOSIS — R079 Chest pain, unspecified: Secondary | ICD-10-CM | POA: Diagnosis not present

## 2015-04-14 LAB — TROPONIN I

## 2015-04-14 LAB — RAPID URINE DRUG SCREEN, HOSP PERFORMED
Amphetamines: NOT DETECTED
Barbiturates: NOT DETECTED
Benzodiazepines: NOT DETECTED
COCAINE: NOT DETECTED
Opiates: NOT DETECTED
Tetrahydrocannabinol: NOT DETECTED

## 2015-04-14 LAB — HEMOGLOBIN A1C
Hgb A1c MFr Bld: 5.6 % (ref 4.8–5.6)
Mean Plasma Glucose: 114 mg/dL

## 2015-04-14 MED ORDER — SIMVASTATIN 20 MG PO TABS: 20.0000 mg | ORAL_TABLET | Freq: Every day | ORAL | Status: AC

## 2015-04-14 MED ORDER — ASPIRIN 81 MG PO TBEC: 81.0000 mg | DELAYED_RELEASE_TABLET | Freq: Every day | ORAL | Status: AC

## 2015-04-14 MED ORDER — HYDROCHLOROTHIAZIDE 12.5 MG PO CAPS: 12.5000 mg | ORAL_CAPSULE | Freq: Every day | ORAL | Status: AC

## 2015-04-14 MED ORDER — PANTOPRAZOLE SODIUM 40 MG PO TBEC: 40.0000 mg | DELAYED_RELEASE_TABLET | Freq: Every day | ORAL | Status: AC

## 2015-04-14 MED ORDER — SIMVASTATIN 20 MG PO TABS: 20.0000 mg | ORAL_TABLET | Freq: Every day | ORAL | Status: DC

## 2015-04-14 NOTE — Discharge Summary (Signed)
Name: [name] MRN: [id number] DOB: [birth year] 64 y.o. PCP: Adline Potter, DO  Date of Admission: 04/13/2015 12:06 PM Date of Discharge: 04/14/2015 Attending Physician: Aldine Contes, MD  Discharge Diagnosis: Principal Problem:   Chest pain at rest Active Problems:   Essential hypertension   GERD (gastroesophageal reflux disease)   Hyperlipidemia  Discharge Medications:   Medication List    TAKE these medications        aspirin 81 MG EC tablet  Take 1 tablet (81 mg total) by mouth daily.     gabapentin 300 MG capsule  Commonly known as:  NEURONTIN  Take 300 mg by mouth at bedtime.     hydrochlorothiazide 12.5 MG capsule  Commonly known as:  MICROZIDE  Take 1 capsule (12.5 mg total) by mouth daily.     lamoTRIgine 100 MG tablet  Commonly known as:  LAMICTAL  Take 100 mg by mouth 2 (two) times daily.     pantoprazole 40 MG tablet  Commonly known as:  PROTONIX  Take 1 tablet (40 mg total) by mouth daily.     sertraline 50 MG tablet  Commonly known as:  ZOLOFT  Take 150 mg by mouth daily.     simvastatin 20 MG tablet  Commonly known as:  ZOCOR  Take 1 tablet (20 mg total) by mouth daily at 6 PM.        Disposition and follow-up:   [name] was discharged from [facility] in Stable condition.  At the hospital follow up visit please address:  1.  Chest pain: She was started on moderate intensity statin: simvastatin 20mg  daily and aspirin 81mg  daily. She was also started on Protonix 40mg  daily. Recommend consideration of exercise stress test.  Essential Hypertension: She was started on HCTZ 12.5mg  daily.  2.  Labs / imaging needed at time of follow-up: BMP  3.  Pending labs/ test needing follow-up: None  Follow-up Appointments:     Follow-up Information    Follow up with Adline Potter, DO. Go on 04/21/2015.   Specialty:  Family Medicine   Why:  5:40PM for hospital follow up and consideration of exercise stress  test.   Contact information:   [address] [place] [address] [phone]       Discharge Instructions: Discharge Instructions    Call MD for:  difficulty breathing, headache or visual disturbances    Complete by:  As directed      Call MD for:  persistant dizziness or light-headedness    Complete by:  As directed      Call MD for:  persistant nausea and vomiting    Complete by:  As directed      Call MD for:  severe uncontrolled pain    Complete by:  As directed      Call MD for:  temperature >100.4    Complete by:  As directed      Diet - low sodium heart healthy    Complete by:  As directed      Increase activity slowly    Complete by:  As directed            Consultations: Cardiology  Procedures Performed:  Dg Chest 2 View  04/13/2015   CLINICAL DATA:  Chest pain  EXAM: CHEST  2 VIEW  COMPARISON:  12/30/2007  FINDINGS: Cardiomediastinal silhouette is stable. No acute infiltrate or pleural effusion. No pulmonary edema. Bony thorax is unremarkable.  IMPRESSION: No active cardiopulmonary disease.  Electronically Signed   By: Lahoma Crocker M.D.   On: 04/13/2015 12:42    2D Echo:  Study Conclusions  - Left ventricle: The cavity size was normal. Systolic function was normal. The estimated ejection fraction was in the range of 55% to 60%. Wall motion was normal; there were no regional wall motion abnormalities. - Aortic valve: There was trivial regurgitation. - Atrial septum: No defect or patent foramen ovale was identified.  Transthoracic echocardiography. M-mode, complete 2D, spectral Doppler, and color Doppler. Birthdate: Patient birthdate: September 14, 1951. Age: Patient is 64 yr old. Sex: Gender: female. BMI: 21.1 kg/m^2. Blood pressure:   136/81 Patient status: Inpatient. Study date: Study date: 04/14/2015. Study time: 07:57 AM. Location: Bedside.   Admission HPI: [name] is a 64 year old woman with history of depression  presenting with chest pain. The chest pain started 2.5 weeks ago, and she has had 4 episodes. The last episode was yesterday. The episodes usually last less than 20 minutes. The last one was the longest and lasted about 20 minutes. Onset is sudden. She describes the pain as burning midsternum. Radiates to her back. No exacerbating or relieving factors. It occurs at rest. She has associated bilateral jaw pain that is sharp and constant. No similar episodes before. She feels warm with these episodes but denies diaphoresis, SOB, nausea, anxiety. Denies acid reflux, exertional chest pain or shortness of breath.   Hospital Course by problem list:  1. Chest pain: Stress and anxiety may be contributing to her symptoms as she mentioned that she has been having more stress due to school/job. GERD is also another possibility as she reports burning sensation. CXR with no acute cardiopulmonary process. Initial troponin negative and EKG without acute ischemic changes. She received ASA 325mg  x 1. She was admitted for further evaluation. UDS unremarkable. Repeat EKG unremarkable and troponins negative x 3. Hgb A1c 5.6%. Total cholesterol 252, HDL 78 and LDL 161. 10.7% 10-year risk of heart disease or stroke. Echo with LV EF 55-60%. Normal wall motion. She was started on moderate intensity statin: simvastatin 20mg  daily and aspirin 81mg  daily. She was also started on Protonix 40mg  daily. At discharge, she had no further episodes of chest pain. She will follow up with her PCP. Recommend consideration of exercise stress test.  2. Essential Hypertension: BP 150/88-170/92 on admission. She was started on HCTZ 12.5mg  daily. BP improved to 136/81 at discharge.   Discharge Vitals:   BP 136/81 mmHg  Pulse 75  Temp(Src) 98 F (36.7 C) (Oral)  Resp 18  Ht 5\' 2"  (1.575 m)  Wt 119 lb 4.8 oz (54.114 kg)  BMI 21.81 kg/m2  SpO2 100%  Discharge Labs:  Results for orders placed or performed during the hospital encounter of  04/13/15 (from the past 24 hour(s))  Basic metabolic panel     Status: Abnormal   Collection Time: 04/13/15 12:00 PM  Result Value Ref Range   Sodium 138 135 - 145 mmol/L   Potassium 3.9 3.5 - 5.1 mmol/L   Chloride 102 101 - 111 mmol/L   CO2 27 22 - 32 mmol/L   Glucose, Bld 135 (H) 65 - 99 mg/dL   BUN 6 6 - 20 mg/dL   Creatinine, Ser 0.75 0.44 - 1.00 mg/dL   Calcium 10.0 8.9 - 10.3 mg/dL   GFR calc non Af Amer >60 >60 mL/min   GFR calc Af Amer >60 >60 mL/min   Anion gap 9 5 - 15  CBC     Status:  None   Collection Time: 04/13/15 12:00 PM  Result Value Ref Range   WBC 6.3 4.0 - 10.5 K/uL   RBC 4.65 3.87 - 5.11 MIL/uL   Hemoglobin 14.5 12.0 - 15.0 g/dL   HCT 42.0 36.0 - 46.0 %   MCV 90.3 78.0 - 100.0 fL   MCH 31.2 26.0 - 34.0 pg   MCHC 34.5 30.0 - 36.0 g/dL   RDW 13.0 11.5 - 15.5 %   Platelets 267 150 - 400 K/uL  I-stat troponin, ED     Status: None   Collection Time: 04/13/15 12:13 PM  Result Value Ref Range   Troponin i, poc 0.00 0.00 - 0.08 ng/mL   Comment 3          Troponin I     Status: None   Collection Time: 04/13/15  7:14 PM  Result Value Ref Range   Troponin I <0.03 <0.031 ng/mL  Hemoglobin A1c     Status: None   Collection Time: 04/13/15  7:14 PM  Result Value Ref Range   Hgb A1c MFr Bld 5.6 4.8 - 5.6 %   Mean Plasma Glucose 114 mg/dL  Lipid panel     Status: Abnormal   Collection Time: 04/13/15  7:14 PM  Result Value Ref Range   Cholesterol 252 (H) 0 - 200 mg/dL   Triglycerides 67 <150 mg/dL   HDL 78 >40 mg/dL   Total CHOL/HDL Ratio 3.2 RATIO   VLDL 13 0 - 40 mg/dL   LDL Cholesterol 161 (H) 0 - 99 mg/dL  Troponin I     Status: None   Collection Time: 04/14/15 12:31 AM  Result Value Ref Range   Troponin I <0.03 <0.031 ng/mL  Urine rapid drug screen (hosp performed)     Status: None   Collection Time: 04/14/15  6:37 AM  Result Value Ref Range   Opiates NONE DETECTED NONE DETECTED   Cocaine NONE DETECTED NONE DETECTED   Benzodiazepines NONE DETECTED  NONE DETECTED   Amphetamines NONE DETECTED NONE DETECTED   Tetrahydrocannabinol NONE DETECTED NONE DETECTED   Barbiturates NONE DETECTED NONE DETECTED    Signed: Milagros Loll, MD 04/14/2015, 11:05 AM    Services Ordered on Discharge: None Equipment Ordered on Discharge: None

## 2015-04-14 NOTE — Progress Notes (Signed)
Subjective: No acute events overnight. She reports some mild chest pain that lasted a few minutes last night and has since resolved. No CP or shortness of breath this morning. Doing well overall.   Objective: Vital signs in last 24 hours: Filed Vitals:   04/13/15 1515 04/13/15 1539 04/13/15 2018 04/14/15 0505  BP: 152/81 162/90 148/76 136/81  Pulse: 69 65 73 75  Temp:  98.8 F (37.1 C) 98.4 F (36.9 C) 98 F (36.7 C)  TempSrc:  Oral Oral Oral  Resp: 12 14 16 18   Height:  5\' 2"  (1.575 m)    Weight:  120 lb 11.2 oz (54.749 kg)  119 lb 4.8 oz (54.114 kg)  SpO2: 95% 99% 95% 100%   Weight change:   Intake/Output Summary (Last 24 hours) at 04/14/15 1028 Last data filed at 04/13/15 2200  Gross per 24 hour  Intake      0 ml  Output    700 ml  Net   -700 ml   General Apperance: NAD HEENT: Normocephalic, atraumatic, PERRL, EOMI, anicteric sclera Neck: Supple, trachea midline Lungs: Clear to auscultation bilaterally. No wheezes, rhonchi or rales. Breathing comfortably Heart: Regular rate and rhythm, no murmur/rub/gallop Abdomen: Soft, nontender, nondistended, no rebound/guarding Extremities: Normal, atraumatic, warm and well perfused, no edema Pulses: 2+ throughout Skin: No rashes or lesions Neurologic: Alert and oriented x 3. CNII-XII intact. Normal strength and sensation  Lab Results: Basic Metabolic Panel:  Recent Labs Lab 04/13/15 1200  NA 138  K 3.9  CL 102  CO2 27  GLUCOSE 135*  BUN 6  CREATININE 0.75  CALCIUM 10.0   CBC:  Recent Labs Lab 04/13/15 1200  WBC 6.3  HGB 14.5  HCT 42.0  MCV 90.3  PLT 267   Cardiac Enzymes:  Recent Labs Lab 04/13/15 1914 04/14/15 0031  TROPONINI <0.03 <0.03   Hemoglobin A1C:  Recent Labs Lab 04/13/15 1914  HGBA1C 5.6   Fasting Lipid Panel:  Recent Labs Lab 04/13/15 1914  CHOL 252*  HDL 78  LDLCALC 161*  TRIG 67  CHOLHDL 3.2   Urine Drug Screen: Drugs of Abuse     Component Value Date/Time   LABOPIA NONE DETECTED 04/14/2015 0637   COCAINSCRNUR NONE DETECTED 04/14/2015 0637   LABBENZ NONE DETECTED 04/14/2015 0637   AMPHETMU NONE DETECTED 04/14/2015 0637   THCU NONE DETECTED 04/14/2015 0637   LABBARB NONE DETECTED 04/14/2015 3704    Studies/Results: Dg Chest 2 View  04/13/2015   CLINICAL DATA:  Chest pain  EXAM: CHEST  2 VIEW  COMPARISON:  12/30/2007  FINDINGS: Cardiomediastinal silhouette is stable. No acute infiltrate or pleural effusion. No pulmonary edema. Bony thorax is unremarkable.  IMPRESSION: No active cardiopulmonary disease.   Electronically Signed   By: Lahoma Crocker M.D.   On: 04/13/2015 12:42   Medications: I have reviewed the patient's current medications. Scheduled Meds: . aspirin EC  81 mg Oral Daily  . enoxaparin (LOVENOX) injection  40 mg Subcutaneous Q24H  . gabapentin  300 mg Oral QHS  . hydrochlorothiazide  12.5 mg Oral Daily  . lamoTRIgine  100 mg Oral BID  . pantoprazole  40 mg Oral Daily  . sertraline  150 mg Oral Daily  . sodium chloride  3 mL Intravenous Q12H   Continuous Infusions:  PRN Meds:.ondansetron **OR** ondansetron (ZOFRAN) IV Assessment/Plan: Active Problems:   Chest pain   Chest pain at rest   Essential hypertension  Atypical Chest pain: Stress and anxiety may be contributing to  her symptoms as she mentioned that she has been having more stress due to school/job. GERD is also another possibility as she reports burning sensation. UDS unremarkable. Repeat EKG unremarkable and troponins negative x 3. Hgb A1c 5.6%. Total cholesterol 252, HDL 78 and LDL 161. 10.7% 10-year risk of heart disease or stroke. Echo with LV EF 55-60%. Normal wall motion. -Will start moderate intensity statin: simvastatin 20mg  daily -ASA 81mg  daily -Protonix 40mg  daily -Will have pt follow up with PCP for consideration of exercise stress test  Hypertension: BP 150/88-170/92 on admission. Improved this morning to 136/81. -Continue HCTZ 12.5mg  daily  Depression:  Continue home gabapentin 300mg  QHS, Lamictal 100mg  BID, sertraline 150mg  daily  FEN: General diet  VTE ppx: lovenox  Dispo: Likely home today  The patient does have a current PCP Adline Potter, DO) and does not need an [facility] hospital follow-up appointment after discharge.   The patient does not have transportation limitations that hinder transportation to clinic appointments.  .Services Needed at time of discharge: Y = Yes, Blank = No PT:   OT:   RN:   Equipment:   Other:       Milagros Loll, MD 04/14/2015, 10:28 AM

## 2015-04-14 NOTE — Discharge Instructions (Signed)
Chest Pain It is often hard to give a diagnosis for the cause of chest pain. There is always a chance that your pain could be related to something serious, such as a heart attack or a blood clot in the lungs. You need to follow up with your doctor. HOME CARE  Do not use any tobacco products. This includes cigarettes, chewing tobacco, and e-cigarettes.  Avoid drinking alcohol.  Only take medicine as told by your doctor.  Follow your doctor's suggestions for more testing if your chest pain does not go away.  Keep all doctor visits you made. GET HELP IF:  Your chest pain does not go away, even after treatment.  You have a rash with blisters on your chest.  You have a fever. GET HELP RIGHT AWAY IF:   You have more pain or pain that spreads to your arm, neck, jaw, back, or belly (abdomen).  You have shortness of breath.  You cough more than usual or cough up blood.  You have very bad back or belly pain.  You feel sick to your stomach (nauseous) or throw up (vomit).  You have very bad weakness.  You pass out (faint).  You have chills. This is an emergency. Do not wait to see if the problems will go away. Call your local emergency services (911 in [place] Do not drive yourself to the hospital. MAKE SURE YOU:   Understand these instructions.  Will watch your condition.  Will get help right away if you are not doing well or get worse. Document Released: 03/05/2008 Document Revised: 09/22/2013 Document Reviewed: 03/05/2008 [facility] Patient Information 2015 [place], [place]. This information is not intended to replace advice given to you by your health care provider. Make sure you discuss any questions you have with your health care provider.

## 2015-04-14 NOTE — Progress Notes (Signed)
  Echocardiogram 2D Echocardiogram has been performed.  Darlina Sicilian M 04/14/2015, 8:41 AM

## 2015-04-14 NOTE — Consult Note (Signed)
CARDIOLOGY CONSULT NOTE       Patient ID: [name] MRN: [id number] DOB/AGE: 03-12-1951 64 y.o.  Admit date: 04/13/2015 Referring Physician: Dareen Piano Primary Physician: Adline Potter, DO Primary Cardiologist:  New Reason for Consultation:  Chest Pain  Active Problems:   Chest pain   Chest pain at rest   Essential hypertension   HPI:   64 y.o. The chest pain started 2.5 weeks ago, and she has had 4 episodes. The last episode was yesterday. The episodes usually last less than 20 minutes. The last one was the longest and lasted about 20 minutes. Onset is sudden. She describes the pain as burning midsternum. Radiates to her back. No exacerbating or relieving factors. It occurs at rest. She has associated bilateral jaw pain that is sharp and constant. No similar episodes before. She feels warm with these episodes but denies diaphoresis, SOB, nausea, anxiety. Denies acid reflux, exertional chest pain or shortness of breath. Pain free this am.  Getting echo.  CRF HTN and elevated lipids.  She seems anxious to get back to work/school this am.    ROS All other systems reviewed and negative except as noted above  Past Medical History  Diagnosis Date  . Hypertension   . Hypercholesterolemia   . Pneumonia ~ 1960; 1985  . Hypothyroidism     "took Levothyroxin for ~ 5 yr; ran out of the RX; rechecked thyroid 6 months later; said I didn't need to be on RX anymore" (04/13/2015)  . Morphea basal cell carcinoma 2000; 2001    "fnose"  . Arthritis     "base of left thumb" (04/13/2015)  . De Quervain's tenosynovitis   . Situational depression     "while going thru divorce" (04/13/2015)    History reviewed. No pertinent family history.  History   Social History  . Marital Status: Divorced    Spouse Name: N/A  . Number of Children: N/A  . Years of Education: N/A   Occupational History  . Not on file.   Social History Main Topics  . Smoking status: Current Every Day Smoker -- 1.25  packs/day for 12 years    Types: Cigarettes  . Smokeless tobacco: Never Used  . Alcohol Use: 3.6 oz/week    6 Cans of beer per week  . Drug Use: No  . Sexual Activity: Not Currently   Other Topics Concern  . Not on file   Social History Narrative  . No narrative on file    Past Surgical History  Procedure Laterality Date  . Appendectomy  1985  . Ankle fracture surgery Left 2010    "shattered on both sides"  . Fracture surgery    . Mohs surgery  2000; 2001    "nose; nose"     . aspirin EC  81 mg Oral Daily  . enoxaparin (LOVENOX) injection  40 mg Subcutaneous Q24H  . gabapentin  300 mg Oral QHS  . hydrochlorothiazide  12.5 mg Oral Daily  . lamoTRIgine  100 mg Oral BID  . pantoprazole  40 mg Oral Daily  . sertraline  150 mg Oral Daily  . sodium chloride  3 mL Intravenous Q12H      Physical Exam: Blood pressure 136/81, pulse 75, temperature 98 F (36.7 C), temperature source Oral, resp. rate 18, height 5\' 2"  (1.575 m), weight 54.114 kg (119 lb 4.8 oz), SpO2 100 %.   Affect appropriate Healthy:  appears stated age 64: normal Neck supple with no adenopathy JVP normal no bruits  no thyromegaly Lungs clear with no wheezing and good diaphragmatic motion Heart:  S1/S2 no murmur, no rub, gallop or click PMI normal Abdomen: benighn, BS positve, no tenderness, no AAA no bruit.  No HSM or HJR Distal pulses intact with no bruits No edema Neuro non-focal Skin warm and dry No muscular weakness   Labs:   Lab Results  Component Value Date   WBC 6.3 04/13/2015   HGB 14.5 04/13/2015   HCT 42.0 04/13/2015   MCV 90.3 04/13/2015   PLT 267 04/13/2015    Recent Labs Lab 04/13/15 1200  NA 138  K 3.9  CL 102  CO2 27  BUN 6  CREATININE 0.75  CALCIUM 10.0  GLUCOSE 135*   Lab Results  Component Value Date   TROPONINI <0.03 04/14/2015    Lab Results  Component Value Date   CHOL 252* 04/13/2015   Lab Results  Component Value Date   HDL 78 04/13/2015   Lab  Results  Component Value Date   LDLCALC 161* 04/13/2015   Lab Results  Component Value Date   TRIG 67 04/13/2015   Lab Results  Component Value Date   CHOLHDL 3.2 04/13/2015   No results found for: LDLDIRECT    Radiology: Dg Chest 2 View  04/13/2015   CLINICAL DATA:  Chest pain  EXAM: CHEST  2 VIEW  COMPARISON:  12/30/2007  FINDINGS: Cardiomediastinal silhouette is stable. No acute infiltrate or pleural effusion. No pulmonary edema. Bony thorax is unremarkable.  IMPRESSION: No active cardiopulmonary disease.   Electronically Signed   By: Lahoma Crocker M.D.   On: 04/13/2015 12:42    EKG:  NSR no acute ST changes    ASSESSMENT AND PLAN:  Chest Pain:  Atypical , resolved r/o negative troponin and no acute ECG changes.  Preliminary echo EF normal I will formally read latter this am  Ok to d/c latter this am.  FU Dr Doy Hutching can consider outpatient ETT.    Chol:  LDL over 160 would start low dose simvastatin 10 mg and have primary follow labs  Depression:  Continue zoloft  Signed: Jenkins Rouge 04/14/2015, 8:32 AM

## 2016-12-14 IMAGING — CR DG CHEST 2V
2 series · 2 of 2 positions shown · non-contrast
Comparison: 12/30/2007

CLINICAL DATA: Chest pain

EXAM:
CHEST  2 VIEW

[chest pa]
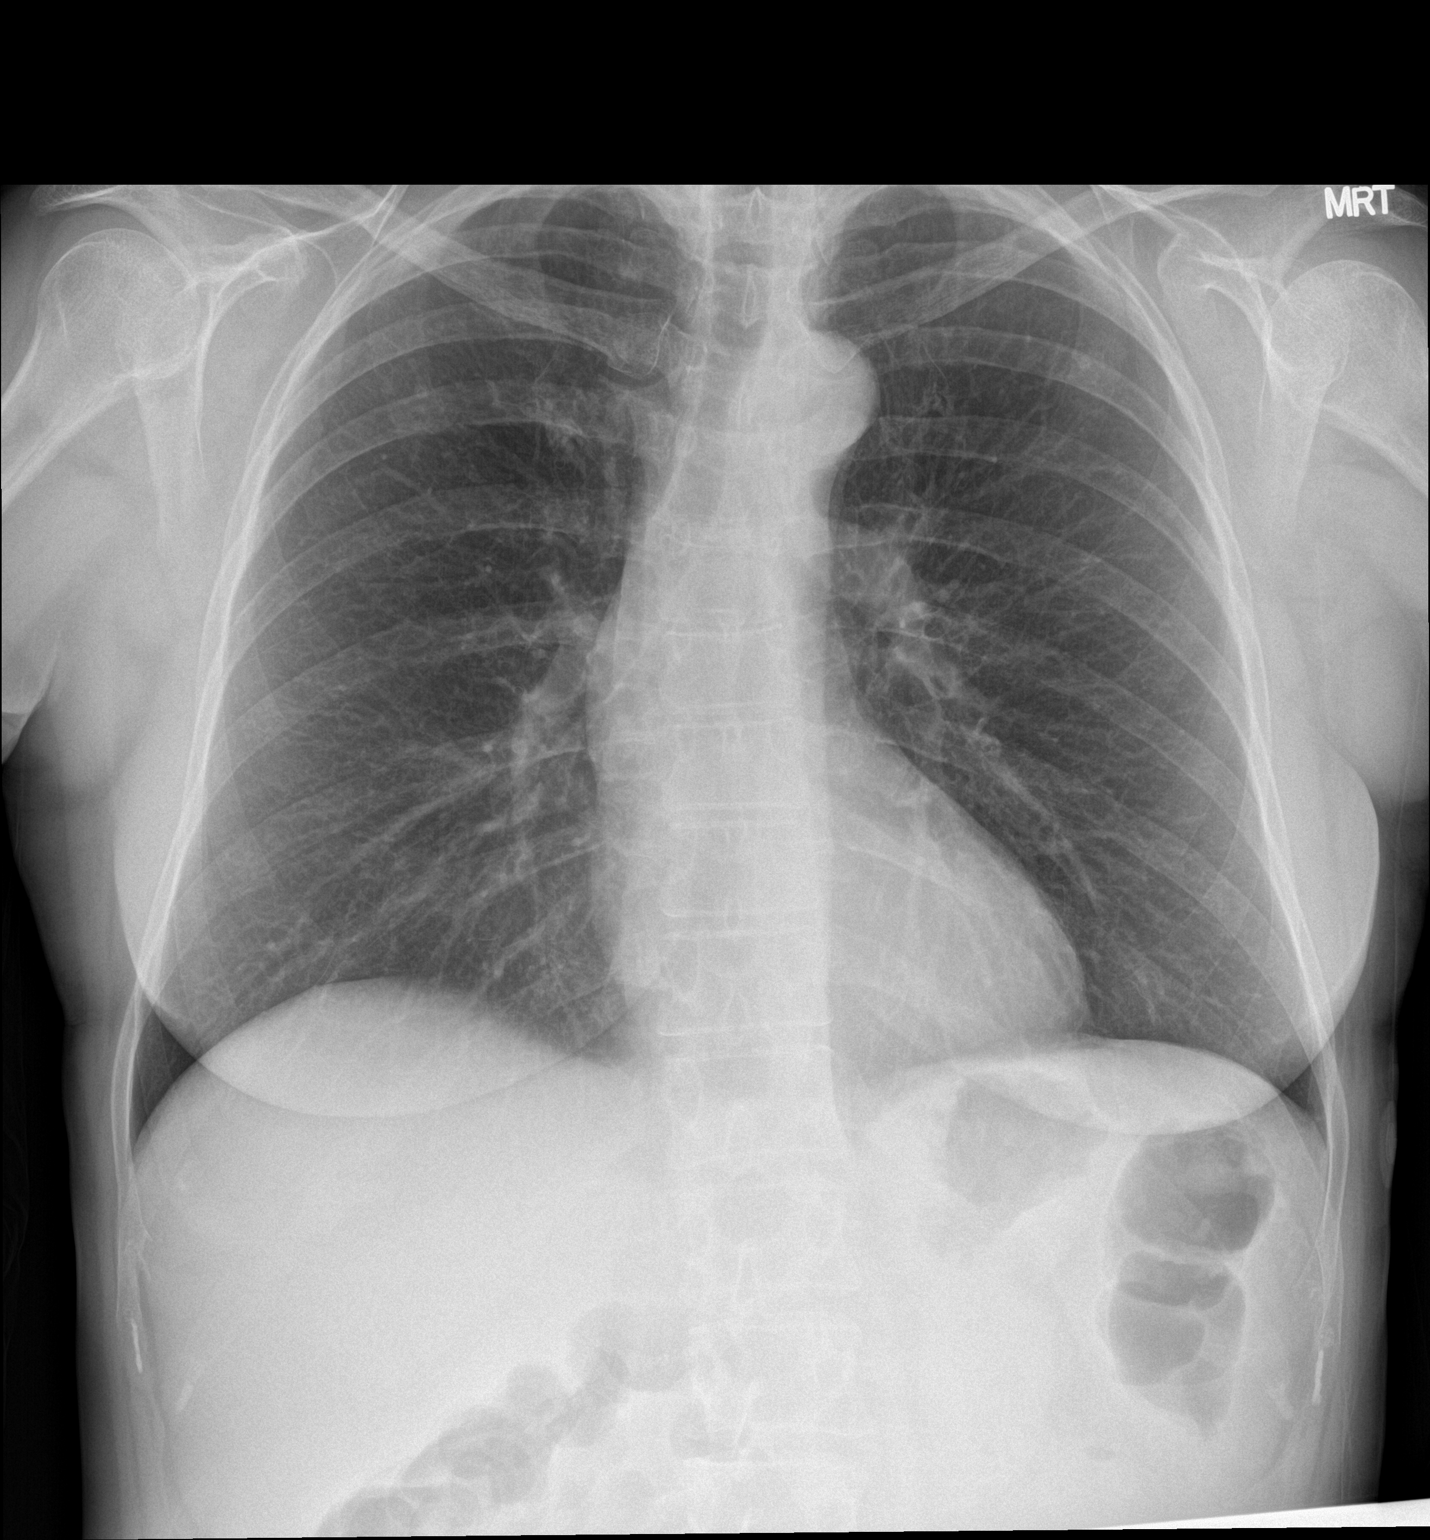

[chest lat]
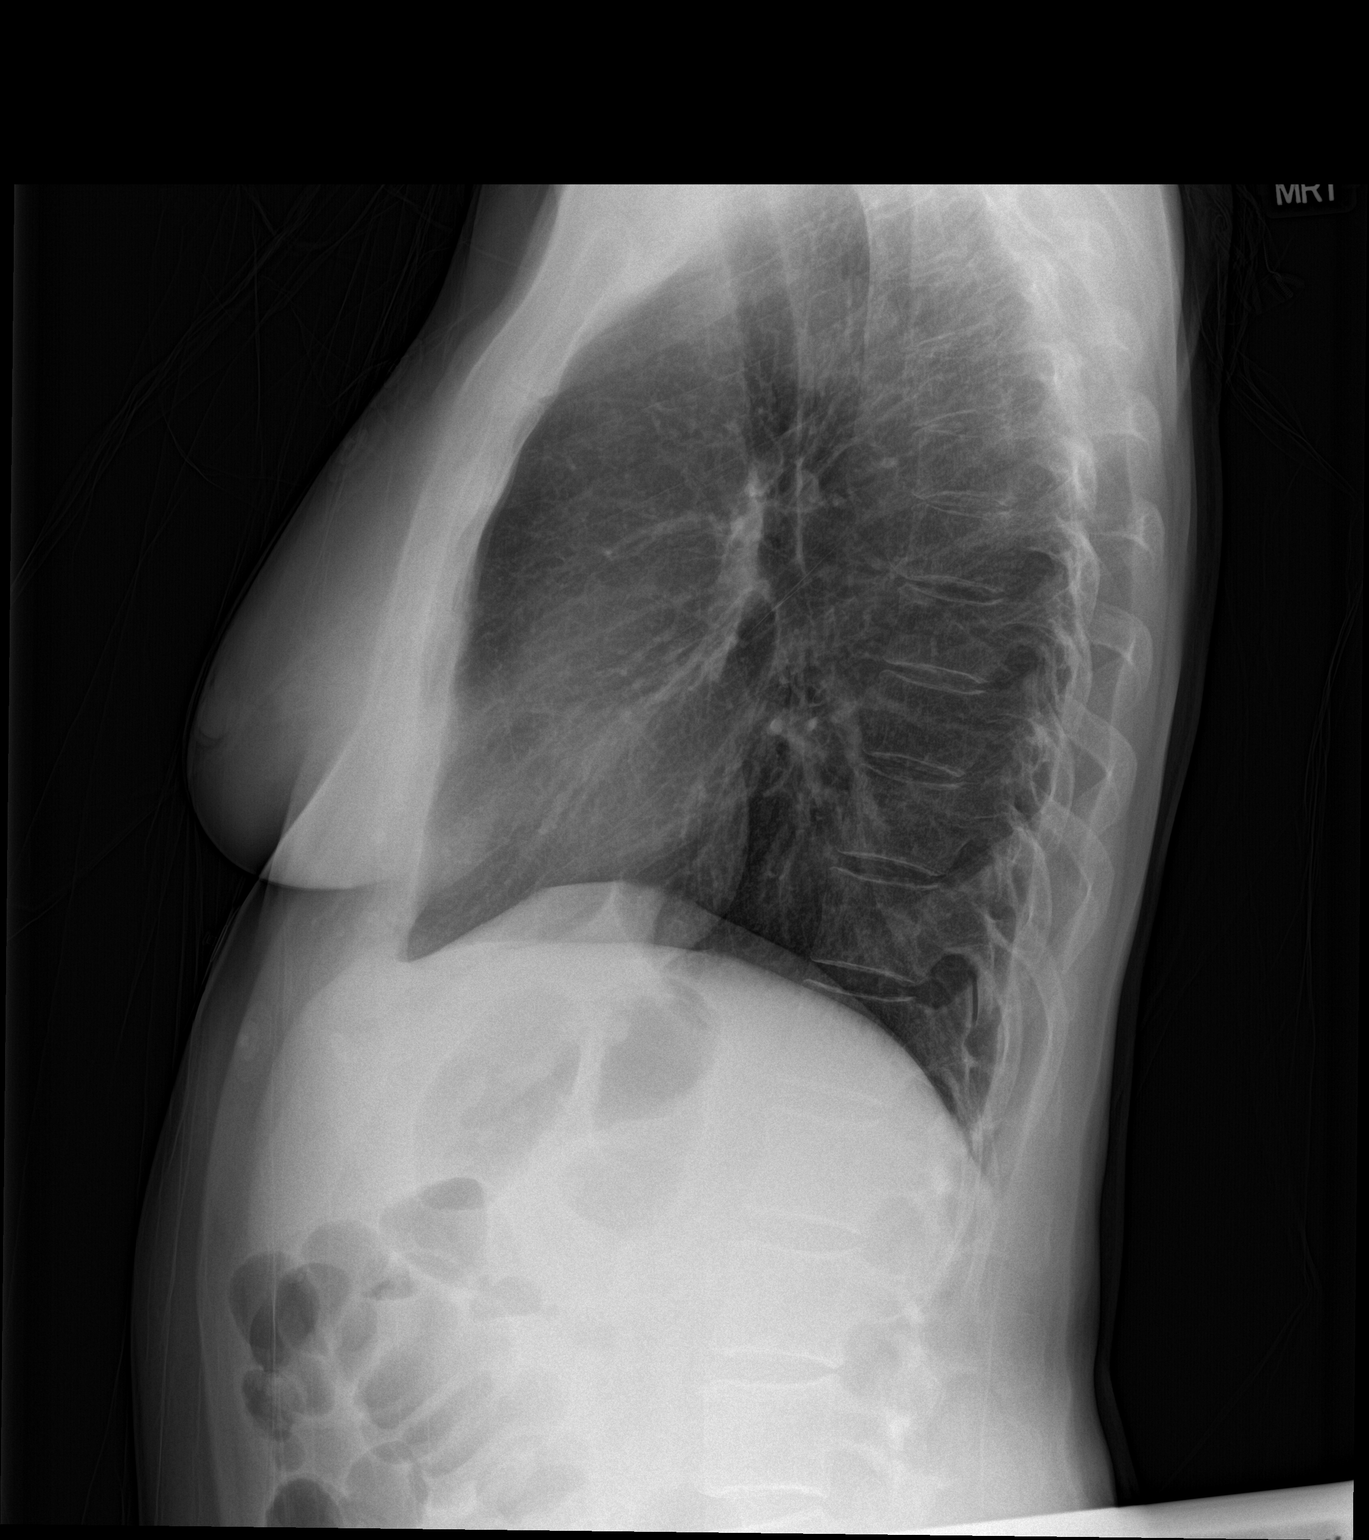

[2 of 2 positions shown; findings below may reference images not displayed]

FINDINGS: Cardiomediastinal silhouette is stable. No acute infiltrate or
pleural effusion. No pulmonary edema. Bony thorax is unremarkable.
IMPRESSION: No active cardiopulmonary disease.
